# Patient Record
Sex: Male | Born: 1945 | Race: White | Hispanic: No | Marital: Married
Health system: Southern US, Community
[De-identification: ages and names within clinical notes are randomized; demographics above are authoritative.]

---

## 2020-09-16 ENCOUNTER — Other Ambulatory Visit: Payer: Self-pay | Admitting: Oncology

## 2020-09-16 ENCOUNTER — Other Ambulatory Visit (HOSPITAL_COMMUNITY): Payer: Self-pay | Admitting: Oncology

## 2020-09-16 DIAGNOSIS — J910 Malignant pleural effusion: Secondary | ICD-10-CM

## 2020-09-16 DIAGNOSIS — C7951 Secondary malignant neoplasm of bone: Secondary | ICD-10-CM

## 2020-10-03 ENCOUNTER — Ambulatory Visit (HOSPITAL_COMMUNITY): Payer: Medicare Other

## 2020-10-03 ENCOUNTER — Encounter (HOSPITAL_COMMUNITY): Payer: Self-pay

## 2020-10-05 ENCOUNTER — Other Ambulatory Visit: Payer: Self-pay

## 2020-10-05 ENCOUNTER — Encounter
Admission: RE | Admit: 2020-10-05 | Discharge: 2020-10-05 | Disposition: A | Discharge status: Home / Self Care | Payer: Medicare Other | Source: Ambulatory Visit | Attending: Oncology | Admitting: Oncology

## 2020-10-05 DIAGNOSIS — C61 Malignant neoplasm of prostate: Secondary | ICD-10-CM | POA: Insufficient documentation

## 2020-10-05 DIAGNOSIS — I700 Atherosclerosis of aorta: Secondary | ICD-10-CM | POA: Insufficient documentation

## 2020-10-05 DIAGNOSIS — R911 Solitary pulmonary nodule: Secondary | ICD-10-CM | POA: Diagnosis not present

## 2020-10-05 DIAGNOSIS — J9811 Atelectasis: Secondary | ICD-10-CM | POA: Insufficient documentation

## 2020-10-05 DIAGNOSIS — C7951 Secondary malignant neoplasm of bone: Secondary | ICD-10-CM | POA: Insufficient documentation

## 2020-10-05 DIAGNOSIS — J910 Malignant pleural effusion: Secondary | ICD-10-CM | POA: Insufficient documentation

## 2020-10-05 DIAGNOSIS — K8020 Calculus of gallbladder without cholecystitis without obstruction: Secondary | ICD-10-CM | POA: Diagnosis not present

## 2020-10-05 LAB — GLUCOSE, CAPILLARY: Glucose-Capillary: 111 mg/dL — ABNORMAL HIGH (ref 70–99)

## 2020-10-05 MED ORDER — FLUDEOXYGLUCOSE F - 18 (FDG) INJECTION
9.3000 | Freq: Once | INTRAVENOUS | Status: AC | PRN
  Administered 2020-10-05: 9.75 via INTRAVENOUS

## 2020-10-19 ENCOUNTER — Ambulatory Visit: Payer: Self-pay | Admitting: Urology

## 2020-11-04 NOTE — Addendum Note (Signed)
Encounter addended by: Annie Paras on: 11/04/2020 4:10 PM  Actions taken: Letter saved

## 2020-11-29 ENCOUNTER — Encounter: Payer: Self-pay | Admitting: Urology

## 2020-11-29 ENCOUNTER — Ambulatory Visit (INDEPENDENT_AMBULATORY_CARE_PROVIDER_SITE_OTHER): Payer: Medicare Other | Admitting: Urology

## 2020-11-29 ENCOUNTER — Other Ambulatory Visit: Payer: Self-pay

## 2020-11-29 VITALS — BP 127/70 | HR 76 | Temp 97.9°F | Ht 69.0 in | Wt 171.0 lb

## 2020-11-29 DIAGNOSIS — N3941 Urge incontinence: Secondary | ICD-10-CM | POA: Diagnosis not present

## 2020-11-29 DIAGNOSIS — C61 Malignant neoplasm of prostate: Secondary | ICD-10-CM | POA: Insufficient documentation

## 2020-11-29 MED ORDER — MIRABEGRON ER 25 MG PO TB24: 25.0000 mg | ORAL_TABLET | Freq: Every day | ORAL | 0 refills | Status: DC

## 2020-11-29 NOTE — Addendum Note (Signed)
Addended by: Pollyann Kennedy F on: 11/29/2020 03:09 PM   Modules accepted: Orders

## 2020-11-29 NOTE — Patient Instructions (Signed)
Prostate Cancer  The prostate is a male gland that helps make semen. It is located below a man's bladder, in front of the rectum. Prostate cancer is when abnormal cells grow in this gland. What are the causes? The cause of this condition is not known. What increases the risk? You are more likely to develop this condition if:  You are 73 years of age or older.  You are African American.  You have a family history of prostate cancer.  You have a family history of breast cancer. What are the signs or symptoms? Symptoms of this condition include:  A need to pee often.  Peeing that is weak, or pee that stops and starts.  Trouble starting or stopping your pee.  Inability to pee.  Blood in your pee or semen.  Pain in the lower back, lower belly (abdomen), hips, or upper thighs.  Trouble getting an erection.  Trouble emptying all of your pee. How is this treated? Treatment for this condition depends on your age, your health, the kind of treatment you like, and how far the cancer has spread. Treatments include:  Being watched. This is called observation. You will be tested from time to time, but you will not get treated. Tests are to make sure that the cancer is not growing.  Surgery. This may be done to remove the prostate, to remove the testicles, or to freeze or kill cancer cells.  Radiation. This uses a strong beam to kill cancer cells.  Ultrasound energy. This uses strong sound waves to kill cancer cells.  Chemotherapy. This uses medicines that stop cancer cells from increasing. This kills cancer cells and healthy cells.  Targeted therapy. This kills cancer cells only. Healthy cells are not affected.  Hormone treatment. This stops the body from making hormones that help the cancer cells to grow. Follow these instructions at home:  Take over-the-counter and prescription medicines only as told by your doctor.  Eat a healthy diet.  Get plenty of sleep.  Ask your  doctor for help to find a support group for men with prostate cancer.  If you have to go to the hospital, let your cancer doctor (oncologist) know.  Treatment may affect your ability to have sex. Touch, hold, hug, and caress your partner to have intimate moments.  Keep all follow-up visits as told by your doctor. This is important. Contact a doctor if:  You have new or more trouble peeing.  You have new or more blood in your pee.  You have new or more pain in your hips, back, or chest. Get help right away if:  You have weakness in your legs.  You lose feeling in your legs.  You cannot control your pee or your poop (stool).  You have chills or a fever. Summary  The prostate is a male gland that helps make semen.  Prostate cancer is when abnormal cells grow in this gland.  Treatment includes doing surgery, using medicines, using very strong beams, or watching without treatment.  Ask your doctor for help to find a support group for men with prostate cancer.  Contact a doctor if you have problems peeing or have any new pain that you did not have before. This information is not intended to replace advice given to you by your health care provider. Make sure you discuss any questions you have with your health care provider. Document Revised: 06/16/2019 Document Reviewed: 06/16/2019 Elsevier Patient Education  2021 Elsevier Inc.  

## 2020-11-29 NOTE — Progress Notes (Signed)
11/29/2020 2:54 PM   Brent Fitzgerald. 1945/10/19 [id number]  Referring provider: Barbee Cough, MD [place],  [place] [address]  Urinary incontinence  HPI: [name] is a 75yo here for evaluation of urinary incontinence. Starting 2-3 month ago he noted daytime urinary incontinence and nocturia 2x. He was hospitalized 2 months ago and had a foley at that time. He had a traumatic foley removal and since then the incontinence worsened. He has a weak urinary stream at night but a normal stream during the day. He uses 3 pads per day which are soaked.  He has a hx of prostatectomy in 2016. He was on ADT at the [place] but has not had any treatment in several months. He has a diagnosis of metastatic prostate cancer to the bone but no imaging/notes are available.  He has nocturia 2x.    PMH: No past medical history on file.  Surgical History:   Home Medications:  Allergies as of 11/29/2020      Reactions   Codeine Other (See Comments), Rash   nervous nervous      Medication List       Accurate as of Nov 29, 2020  2:54 PM. If you have any questions, ask your nurse or doctor.        budesonide-formoterol 160-4.5 MCG/ACT inhaler Commonly known as: SYMBICORT Inhale into the lungs.   Calcium Carb-Cholecalciferol 600-400 MG-UNIT Tabs Take 1 tablet by mouth daily.   cetirizine 10 MG tablet Commonly known as: ZYRTEC Take by mouth.   Cyanocobalamin 5000 MCG Subl Place under the tongue.   doxazosin 4 MG tablet Commonly known as: CARDURA Take by mouth.   flunisolide 25 MCG/ACT (0.025%) Soln Commonly known as: NASALIDE 2 sprays into each nostril daily.   lisinopril 5 MG tablet Commonly known as: ZESTRIL Take 5 mg by mouth daily.   metoprolol tartrate 50 MG tablet Commonly known as: LOPRESSOR Take 50 mg by mouth daily.   pravastatin 80 MG tablet Commonly known as: PRAVACHOL Take 80 mg by mouth daily.       Allergies:  Allergies  Allergen  Reactions  . Codeine Other (See Comments) and Rash    nervous nervous     Family History: No family history on file.  Social History:  has no history on file for tobacco use, alcohol use, and drug use.  ROS: All other review of systems were reviewed and are negative except what is noted above in HPI  Physical Exam: BP 127/70   Pulse 76   Temp 97.9 F (36.6 C)   Ht 5\' 9"  (1.753 m)   Wt 171 lb (77.6 kg)   BMI 25.25 kg/m   Constitutional:  Alert and oriented, No acute distress. HEENT: [place] AT, moist mucus membranes.  Trachea midline, no masses. Cardiovascular: No clubbing, cyanosis, or edema. Respiratory: Normal respiratory effort, no increased work of breathing. GI: Abdomen is soft, nontender, nondistended, no abdominal masses GU: No CVA tenderness.  Lymph: No cervical or inguinal lymphadenopathy. Skin: No rashes, bruises or suspicious lesions. Neurologic: Grossly intact, no focal deficits, moving all 4 extremities. Psychiatric: Normal mood and affect.  Laboratory Data: No results found for: WBC, HGB, HCT, MCV, PLT  No results found for: CREATININE  No results found for: PSA  No results found for: TESTOSTERONE  No results found for: HGBA1C  Urinalysis No results found for: COLORURINE, APPEARANCEUR, LABSPEC, PHURINE, GLUCOSEU, HGBUR, BILIRUBINUR, KETONESUR, PROTEINUR, UROBILINOGEN, NITRITE, LEUKOCYTESUR  No results found  for: LABMICR, WBCUA, RBCUA, LABEPIT, MUCUS, BACTERIA  Pertinent Imaging:  No results found for this or any previous visit.  No results found for this or any previous visit.  No results found for this or any previous visit.  No results found for this or any previous visit.  No results found for this or any previous visit.  No results found for this or any previous visit.  No results found for this or any previous visit.  No results found for this or any previous visit.   Assessment & Plan:    1. Urge incontinence of urine -We will  trial mirabegron 25mg  daily. If this fails to improve his incontinence we will proceed with cystoscopy - BLADDER SCAN AMB NON-IMAGING  2. Prostate Cancer Metastatic -PSA today, will call with results   No follow-ups on file.  Nicolette Bang, MD  [facility] [place]

## 2020-11-29 NOTE — Progress Notes (Signed)
post void residual=0  Urological Symptom Review  Patient is experiencing the following symptoms: Leakage of urine   Review of Systems  Gastrointestinal (upper)  : Negative for upper GI symptoms  Gastrointestinal (lower) : Negative for lower GI symptoms  Constitutional : Negative for symptoms  Skin: Negative for skin symptoms  Eyes: Negative for eye symptoms  Ear/Nose/Throat : Negative for Ear/Nose/Throat symptoms  Hematologic/Lymphatic: Negative for Hematologic/Lymphatic symptoms  Cardiovascular : Negative for cardiovascular symptoms  Respiratory : Negative for respiratory symptoms  Endocrine: Negative for endocrine symptoms  Musculoskeletal: Negative for musculoskeletal symptoms  Neurological: Negative for neurological symptoms  Psychologic: Negative for psychiatric symptoms

## 2020-11-30 LAB — PSA: Prostate Specific Ag, Serum: 1.6 ng/mL (ref 0.0–4.0)

## 2021-01-10 NOTE — Progress Notes (Signed)
Results mailed to patient;.

## 2021-01-13 ENCOUNTER — Encounter: Payer: Self-pay | Admitting: Urology

## 2021-01-13 ENCOUNTER — Other Ambulatory Visit: Payer: Self-pay

## 2021-01-13 ENCOUNTER — Ambulatory Visit (INDEPENDENT_AMBULATORY_CARE_PROVIDER_SITE_OTHER): Payer: Medicare Other | Admitting: Urology

## 2021-01-13 VITALS — BP 127/76 | HR 80

## 2021-01-13 DIAGNOSIS — C61 Malignant neoplasm of prostate: Secondary | ICD-10-CM

## 2021-01-13 DIAGNOSIS — N3941 Urge incontinence: Secondary | ICD-10-CM | POA: Diagnosis not present

## 2021-01-13 LAB — URINALYSIS, ROUTINE W REFLEX MICROSCOPIC
Bilirubin, UA: NEGATIVE
Glucose, UA: NEGATIVE
Ketones, UA: NEGATIVE
Leukocytes,UA: NEGATIVE
Nitrite, UA: NEGATIVE
Protein,UA: NEGATIVE
Specific Gravity, UA: 1.01 (ref 1.005–1.030)
Urobilinogen, Ur: 0.2 mg/dL (ref 0.2–1.0)
pH, UA: 5.5 (ref 5.0–7.5)

## 2021-01-13 LAB — BLADDER SCAN AMB NON-IMAGING: Scan Result: 34

## 2021-01-13 NOTE — Progress Notes (Signed)
post void residual=34  Urological Symptom Review  Patient is experiencing the following symptoms: Get up at night to urinate Stream starts and stops   Review of Systems  Gastrointestinal (upper)  : Negative for upper GI symptoms  Gastrointestinal (lower) : Negative for lower GI symptoms  Constitutional : Negative for symptoms  Skin: Negative for skin symptoms  Eyes: Negative for eye symptoms  Ear/Nose/Throat : Negative for Ear/Nose/Throat symptoms  Hematologic/Lymphatic: Easy bruising  Cardiovascular : Negative for cardiovascular symptoms  Respiratory : Negative for respiratory symptoms  Endocrine: Negative for endocrine symptoms  Musculoskeletal: Negative for musculoskeletal symptoms  Neurological: Negative for neurological symptoms  Psychologic: Negative for psychiatric symptoms

## 2021-01-13 NOTE — Progress Notes (Signed)
\    01/13/21  CC: urinary incontinence   HPI:  Blood pressure 127/76, pulse 80. NED. A&Ox3.   No respiratory distress   Abd soft, NT, ND Normal phallus with bilateral descended testicles  Cystoscopy Procedure Note  Patient identification was confirmed, informed consent was obtained, and patient was prepped using Betadine solution.  Lidocaine jelly was administered per urethral meatus.     Pre-Procedure: - Inspection reveals a normal caliber ureteral meatus.  Procedure: The flexible cystoscope was introduced without difficulty - No urethral strictures/lesions are present. -Open external sphincter with poor coaptstion - Normal bladder neck - Bilateral ureteral orifices identified - Bladder mucosa  reveals no ulcers, tumors, or lesions - No bladder stones - No trabeculation    Post-Procedure: - Patient tolerated the procedure well  Assessment/ Plan: Referral to Dr. Coral Ceo for consideration of AUS versus male sling    Nicolette Bang, MD

## 2021-01-13 NOTE — Patient Instructions (Signed)

## 2021-05-12 ENCOUNTER — Other Ambulatory Visit (HOSPITAL_COMMUNITY): Payer: Self-pay | Admitting: Oncology

## 2021-05-12 DIAGNOSIS — R911 Solitary pulmonary nodule: Secondary | ICD-10-CM

## 2021-05-18 ENCOUNTER — Encounter (HOSPITAL_COMMUNITY)
Admission: RE | Admit: 2021-05-18 | Discharge: 2021-05-18 | Disposition: A | Discharge status: Home / Self Care | Payer: Medicare Other | Source: Ambulatory Visit | Attending: Oncology | Admitting: Oncology

## 2021-05-18 ENCOUNTER — Other Ambulatory Visit: Payer: Self-pay

## 2021-05-18 DIAGNOSIS — R911 Solitary pulmonary nodule: Secondary | ICD-10-CM | POA: Insufficient documentation

## 2021-05-18 MED ORDER — FLUDEOXYGLUCOSE F - 18 (FDG) INJECTION
9.2650 | Freq: Once | INTRAVENOUS | Status: AC | PRN
  Administered 2021-05-18: 9.265 via INTRAVENOUS

## 2021-07-18 ENCOUNTER — Other Ambulatory Visit (HOSPITAL_COMMUNITY): Payer: Self-pay | Admitting: Hematology and Oncology

## 2021-07-18 DIAGNOSIS — C61 Malignant neoplasm of prostate: Secondary | ICD-10-CM

## 2021-07-19 ENCOUNTER — Ambulatory Visit: Payer: Medicare Other | Admitting: Urology

## 2021-07-28 ENCOUNTER — Other Ambulatory Visit (HOSPITAL_COMMUNITY): Payer: Medicare Other

## 2021-07-31 ENCOUNTER — Ambulatory Visit (HOSPITAL_COMMUNITY)
Admission: RE | Admit: 2021-07-31 | Discharge: 2021-07-31 | Disposition: A | Discharge status: Home / Self Care | Payer: Medicare Other | Source: Ambulatory Visit | Attending: Hematology and Oncology | Admitting: Hematology and Oncology

## 2021-07-31 ENCOUNTER — Other Ambulatory Visit: Payer: Self-pay

## 2021-07-31 DIAGNOSIS — C61 Malignant neoplasm of prostate: Secondary | ICD-10-CM | POA: Diagnosis not present

## 2021-07-31 MED ORDER — PIFLIFOLASTAT F 18 (PYLARIFY) INJECTION
9.0000 | Freq: Once | INTRAVENOUS | Status: AC
  Administered 2021-07-31: 8.92 via INTRAVENOUS

## 2021-08-04 ENCOUNTER — Other Ambulatory Visit (HOSPITAL_COMMUNITY): Payer: Self-pay | Admitting: Hematology and Oncology

## 2022-06-19 ENCOUNTER — Other Ambulatory Visit (HOSPITAL_COMMUNITY): Payer: Self-pay | Admitting: Hematology & Oncology

## 2022-06-19 DIAGNOSIS — C61 Malignant neoplasm of prostate: Secondary | ICD-10-CM

## 2022-07-04 ENCOUNTER — Ambulatory Visit (HOSPITAL_COMMUNITY)
Admission: RE | Admit: 2022-07-04 | Discharge: 2022-07-04 | Disposition: A | Discharge status: Home / Self Care | Payer: Medicare Other | Source: Ambulatory Visit | Attending: Hematology & Oncology | Admitting: Hematology & Oncology

## 2022-07-04 DIAGNOSIS — C7951 Secondary malignant neoplasm of bone: Secondary | ICD-10-CM | POA: Insufficient documentation

## 2022-07-04 DIAGNOSIS — C61 Malignant neoplasm of prostate: Secondary | ICD-10-CM | POA: Diagnosis present

## 2022-07-04 MED ORDER — PIFLIFOLASTAT F 18 (PYLARIFY) INJECTION
9.0000 | Freq: Once | INTRAVENOUS | Status: AC
  Administered 2022-07-04: 9.9 via INTRAVENOUS

## 2022-09-05 ENCOUNTER — Other Ambulatory Visit: Payer: Self-pay

## 2022-09-05 DIAGNOSIS — C61 Malignant neoplasm of prostate: Secondary | ICD-10-CM

## 2022-09-19 ENCOUNTER — Ambulatory Visit (HOSPITAL_COMMUNITY)
Admission: RE | Admit: 2022-09-19 | Discharge: 2022-09-19 | Disposition: A | Discharge status: Home / Self Care | Payer: Medicare Other | Source: Ambulatory Visit | Attending: Internal Medicine | Admitting: Internal Medicine

## 2022-09-19 DIAGNOSIS — C61 Malignant neoplasm of prostate: Secondary | ICD-10-CM | POA: Insufficient documentation

## 2022-09-19 MED ORDER — PIFLIFOLASTAT F 18 (PYLARIFY) INJECTION
9.0000 | Freq: Once | INTRAVENOUS | Status: AC
  Administered 2022-09-19: 9.8 via INTRAVENOUS

## 2022-10-09 ENCOUNTER — Ambulatory Visit: Payer: Medicare Other | Admitting: Infectious Disease

## 2022-10-09 ENCOUNTER — Encounter: Payer: Self-pay | Admitting: Infectious Disease

## 2022-10-09 ENCOUNTER — Inpatient Hospital Stay: Admission: RE | Admit: 2022-10-09 | Discharge: 2022-11-14 | Disposition: E | Payer: Medicare Other

## 2022-10-09 DIAGNOSIS — J189 Pneumonia, unspecified organism: Secondary | ICD-10-CM | POA: Insufficient documentation

## 2022-10-09 DIAGNOSIS — J910 Malignant pleural effusion: Secondary | ICD-10-CM | POA: Insufficient documentation

## 2022-10-09 DIAGNOSIS — R7881 Bacteremia: Secondary | ICD-10-CM | POA: Insufficient documentation

## 2022-10-09 DIAGNOSIS — T80219A Unspecified infection due to central venous catheter, initial encounter: Secondary | ICD-10-CM

## 2022-10-09 DIAGNOSIS — I482 Chronic atrial fibrillation, unspecified: Secondary | ICD-10-CM | POA: Insufficient documentation

## 2022-10-09 DIAGNOSIS — J9621 Acute and chronic respiratory failure with hypoxia: Secondary | ICD-10-CM | POA: Insufficient documentation

## 2022-10-09 DIAGNOSIS — A419 Sepsis, unspecified organism: Secondary | ICD-10-CM | POA: Insufficient documentation

## 2022-10-09 HISTORY — DX: Unspecified infection due to central venous catheter, initial encounter: T80.219A

## 2022-10-09 HISTORY — DX: Bacteremia: R78.81

## 2022-10-09 LAB — CBC
HCT: 22.1 % — ABNORMAL LOW (ref 39.0–52.0)
Hemoglobin: 7.4 g/dL — ABNORMAL LOW (ref 13.0–17.0)
MCH: 31.4 pg (ref 26.0–34.0)
MCHC: 33.5 g/dL (ref 30.0–36.0)
MCV: 93.6 fL (ref 80.0–100.0)
Platelets: 167 10*3/uL (ref 150–400)
RBC: 2.36 MIL/uL — ABNORMAL LOW (ref 4.22–5.81)
RDW: 19.2 % — ABNORMAL HIGH (ref 11.5–15.5)
WBC: 14.5 10*3/uL — ABNORMAL HIGH (ref 4.0–10.5)
nRBC: 0 % (ref 0.0–0.2)

## 2022-10-09 LAB — BASIC METABOLIC PANEL
Anion gap: 12 (ref 5–15)
BUN: 55 mg/dL — ABNORMAL HIGH (ref 8–23)
CO2: 22 mmol/L (ref 22–32)
Calcium: 8.1 mg/dL — ABNORMAL LOW (ref 8.9–10.3)
Chloride: 99 mmol/L (ref 98–111)
Creatinine, Ser: 3.67 mg/dL — ABNORMAL HIGH (ref 0.61–1.24)
GFR, Estimated: 17 mL/min — ABNORMAL LOW (ref >60)
Glucose, Bld: 121 mg/dL — ABNORMAL HIGH (ref 70–99)
Potassium: 4.7 mmol/L (ref 3.5–5.1)
Sodium: 133 mmol/L — ABNORMAL LOW (ref 135–145)

## 2022-10-09 NOTE — Progress Notes (Deleted)
Reason for infectious disease consult MRSA bacteremia in the context of a port  [place] physician: Legrand Rams, MD  Subjective:    Patient ID: Brent Feeler., male    DOB: Nov 18, 1945, 77 y.o.   MRN: [id number]  HPI  Brent Fitzgerald is a 77 year old man with past medical history significant for metastatic prostate cancer with port in place on chemotherapy, CAD, CHF who was admitted to [facility] with evidence of pneumonia.  His blood cultures were taken and grew MRSA in both sites.  He was given unnecessarily broad-spectrum antibiotics after the MRSA was discovered as vancomycin was continued alongside Zosyn and then meropenem.  He had a 2D echocardiogram that not have evidence of endocarditis.  I do not find record of a transesophageal echocardiogram to exclude endocarditis though.  His blood cultures cleared but his port was never removed despite the fact it was suspected of being a source of infection.  Regardless of whether it was a source of infection is undoubtably a site that was seeded in the context of his bacteremia and should have been removed.  He has been referred to [place] and infectious disease in [facility] for further management of his care after he was discharged on IV vancomycin.   Past Medical History:  Diagnosis Date  . Infected venous access port 10/09/2022  . MRSA bacteremia 10/09/2022    No past surgical history on file.  No family history on file.    Social History   Socioeconomic History  . Marital status: Married    Spouse name: Not on file  . Number of children: Not on file  . Years of education: Not on file  . Highest education level: Not on file  Occupational History  . Not on file  Tobacco Use  . Smoking status: Not on file  . Smokeless tobacco: Not on file  Substance and Sexual Activity  . Alcohol use: Not on file  . Drug use: Not on file  . Sexual activity: Not on file  Other Topics Concern  . Not on file  Social History  Narrative  . Not on file   Social Determinants of Health   Financial Resource Strain: Not on file  Food Insecurity: Not on file  Transportation Needs: Not on file  Physical Activity: Not on file  Stress: Not on file  Social Connections: Not on file    Allergies  Allergen Reactions  . Codeine Other (See Comments) and Rash    nervous nervous      Current Outpatient Medications:  .  budesonide-formoterol (SYMBICORT) 160-4.5 MCG/ACT inhaler, Inhale into the lungs., Disp: , Rfl:  .  Calcium Carb-Cholecalciferol 600-400 MG-UNIT TABS, Take 1 tablet by mouth daily., Disp: , Rfl:  .  cetirizine (ZYRTEC) 10 MG tablet, Take by mouth., Disp: , Rfl:  .  Cyanocobalamin 5000 MCG SUBL, Place under the tongue., Disp: , Rfl:  .  flunisolide (NASALIDE) 25 MCG/ACT (0.025%) SOLN, 2 sprays into each nostril daily., Disp: , Rfl:  .  lisinopril (ZESTRIL) 5 MG tablet, Take 5 mg by mouth daily., Disp: , Rfl:  .  metoprolol tartrate (LOPRESSOR) 50 MG tablet, Take 50 mg by mouth daily., Disp: , Rfl:  .  mirabegron ER (MYRBETRIQ) 25 MG TB24 tablet, Take 1 tablet (25 mg total) by mouth daily., Disp: 30 tablet, Rfl: 0 .  pravastatin (PRAVACHOL) 80 MG tablet, Take 80 mg by mouth daily., Disp: , Rfl:   Review of Systems  Constitutional:  Negative for activity change,  appetite change, chills, diaphoresis, fatigue, fever and unexpected weight change.  HENT:  Negative for congestion, rhinorrhea, sinus pressure, sneezing, sore throat and trouble swallowing.   Eyes:  Negative for photophobia and visual disturbance.  Respiratory:  Negative for cough, chest tightness, shortness of breath, wheezing and stridor.   Cardiovascular:  Negative for chest pain, palpitations and leg swelling.  Gastrointestinal:  Negative for abdominal distention, abdominal pain, anal bleeding, blood in stool, constipation, diarrhea, nausea and vomiting.  Genitourinary:  Negative for difficulty urinating, dysuria, flank pain and hematuria.   Musculoskeletal:  Negative for arthralgias, back pain, gait problem, joint swelling and myalgias.  Skin:  Negative for color change, pallor, rash and wound.  Neurological:  Negative for dizziness, tremors, weakness and light-headedness.  Hematological:  Negative for adenopathy. Does not bruise/bleed easily.  Psychiatric/Behavioral:  Negative for agitation, behavioral problems, confusion, decreased concentration, dysphoric mood and sleep disturbance.       Objective:   Physical Exam Constitutional:      Appearance: He is well-developed.  HENT:     Head: Normocephalic and atraumatic.  Eyes:     Conjunctiva/sclera: Conjunctivae normal.  Cardiovascular:     Rate and Rhythm: Normal rate and regular rhythm.  Pulmonary:     Effort: Pulmonary effort is normal. No respiratory distress.     Breath sounds: No wheezing.  Abdominal:     General: There is no distension.     Palpations: Abdomen is soft.  Musculoskeletal:        General: No tenderness. Normal range of motion.     Cervical back: Normal range of motion and neck supple.  Skin:    General: Skin is warm and dry.     Coloration: Skin is not pale.     Findings: No erythema or rash.  Neurological:     General: No focal deficit present.     Mental Status: He is alert and oriented to person, place, and time.  Psychiatric:        Mood and Affect: Mood normal.        Behavior: Behavior normal.        Thought Content: Thought content normal.        Judgment: Judgment normal.         Assessment & Plan:

## 2022-10-10 ENCOUNTER — Other Ambulatory Visit (HOSPITAL_COMMUNITY): Payer: Medicare Other

## 2022-10-10 LAB — BASIC METABOLIC PANEL
Anion gap: 14 (ref 5–15)
BUN: 63 mg/dL — ABNORMAL HIGH (ref 8–23)
CO2: 19 mmol/L — ABNORMAL LOW (ref 22–32)
Calcium: 8 mg/dL — ABNORMAL LOW (ref 8.9–10.3)
Chloride: 101 mmol/L (ref 98–111)
Creatinine, Ser: 3.71 mg/dL — ABNORMAL HIGH (ref 0.61–1.24)
GFR, Estimated: 16 mL/min — ABNORMAL LOW (ref >60)
Glucose, Bld: 92 mg/dL (ref 70–99)
Potassium: 5.4 mmol/L — ABNORMAL HIGH (ref 3.5–5.1)
Sodium: 134 mmol/L — ABNORMAL LOW (ref 135–145)

## 2022-10-10 LAB — CBC
HCT: 23.3 % — ABNORMAL LOW (ref 39.0–52.0)
Hemoglobin: 7.2 g/dL — ABNORMAL LOW (ref 13.0–17.0)
MCH: 30.1 pg (ref 26.0–34.0)
MCHC: 30.9 g/dL (ref 30.0–36.0)
MCV: 97.5 fL (ref 80.0–100.0)
Platelets: 171 10*3/uL (ref 150–400)
RBC: 2.39 MIL/uL — ABNORMAL LOW (ref 4.22–5.81)
RDW: 18.9 % — ABNORMAL HIGH (ref 11.5–15.5)
WBC: 14.8 10*3/uL — ABNORMAL HIGH (ref 4.0–10.5)
nRBC: 0 % (ref 0.0–0.2)

## 2022-10-10 LAB — URINALYSIS, ROUTINE W REFLEX MICROSCOPIC
Bilirubin Urine: NEGATIVE
Glucose, UA: NEGATIVE mg/dL
Ketones, ur: 5 mg/dL — AB
Leukocytes,Ua: NEGATIVE
Nitrite: NEGATIVE
Protein, ur: 30 mg/dL — AB
Specific Gravity, Urine: 1.011 (ref 1.005–1.030)
pH: 5 (ref 5.0–8.0)

## 2022-10-10 LAB — BLOOD GAS, ARTERIAL
Acid-base deficit: 4.8 mmol/L — ABNORMAL HIGH (ref 0.0–2.0)
Bicarbonate: 21.6 mmol/L (ref 20.0–28.0)
O2 Saturation: 97.9 %
Patient temperature: 36.9
pCO2 arterial: 44 mmHg (ref 32–48)
pH, Arterial: 7.3 — ABNORMAL LOW (ref 7.35–7.45)
pO2, Arterial: 79 mmHg — ABNORMAL LOW (ref 83–108)

## 2022-10-10 LAB — C DIFFICILE QUICK SCREEN W PCR REFLEX
C Diff antigen: NEGATIVE
C Diff interpretation: NOT DETECTED
C Diff toxin: NEGATIVE

## 2022-10-10 LAB — CK: Total CK: 41 U/L — ABNORMAL LOW (ref 49–397)

## 2022-10-11 ENCOUNTER — Other Ambulatory Visit (HOSPITAL_COMMUNITY): Payer: Medicare Other

## 2022-10-11 DIAGNOSIS — R6520 Severe sepsis without septic shock: Secondary | ICD-10-CM

## 2022-10-11 DIAGNOSIS — I482 Chronic atrial fibrillation, unspecified: Secondary | ICD-10-CM | POA: Diagnosis not present

## 2022-10-11 DIAGNOSIS — J189 Pneumonia, unspecified organism: Secondary | ICD-10-CM

## 2022-10-11 DIAGNOSIS — J9621 Acute and chronic respiratory failure with hypoxia: Secondary | ICD-10-CM | POA: Diagnosis not present

## 2022-10-11 DIAGNOSIS — A419 Sepsis, unspecified organism: Secondary | ICD-10-CM

## 2022-10-11 DIAGNOSIS — J910 Malignant pleural effusion: Secondary | ICD-10-CM

## 2022-10-11 LAB — CK: Total CK: 23 U/L — ABNORMAL LOW (ref 49–397)

## 2022-10-11 LAB — CBC
HCT: 25.6 % — ABNORMAL LOW (ref 39.0–52.0)
Hemoglobin: 7.9 g/dL — ABNORMAL LOW (ref 13.0–17.0)
MCH: 29.9 pg (ref 26.0–34.0)
MCHC: 30.9 g/dL (ref 30.0–36.0)
MCV: 97 fL (ref 80.0–100.0)
Platelets: 165 10*3/uL (ref 150–400)
RBC: 2.64 MIL/uL — ABNORMAL LOW (ref 4.22–5.81)
RDW: 19 % — ABNORMAL HIGH (ref 11.5–15.5)
WBC: 18.8 10*3/uL — ABNORMAL HIGH (ref 4.0–10.5)
nRBC: 0 % (ref 0.0–0.2)

## 2022-10-11 LAB — BASIC METABOLIC PANEL
Anion gap: 13 (ref 5–15)
BUN: 68 mg/dL — ABNORMAL HIGH (ref 8–23)
CO2: 21 mmol/L — ABNORMAL LOW (ref 22–32)
Calcium: 8.2 mg/dL — ABNORMAL LOW (ref 8.9–10.3)
Chloride: 104 mmol/L (ref 98–111)
Creatinine, Ser: 3.82 mg/dL — ABNORMAL HIGH (ref 0.61–1.24)
GFR, Estimated: 16 mL/min — ABNORMAL LOW (ref >60)
Glucose, Bld: 116 mg/dL — ABNORMAL HIGH (ref 70–99)
Potassium: 4.8 mmol/L (ref 3.5–5.1)
Sodium: 138 mmol/L (ref 135–145)

## 2022-10-11 NOTE — Consult Note (Signed)
[facility] [place]  PULMONARY SERVICE  Date of Service: 10/11/2022  PULMONARY CRITICAL CARE CONSULT   Brent Fitzgerald.  [id number]  DOB: [birth year]   DOA: 10/09/2022  Referring Physician: Satira Sark, MD  HPI: Brent Fitzgerald. is a 77 y.o. male seen for follow up of Acute on Chronic Respiratory Failure.  Patient has multiple medical problems including chronic obstructive pulmonary disease prostate cancer hyperlipidemia malignant pleural effusion pneumonia who presents to the hospital because of increasing shortness of breath productive cough and weakness secondary to a left upper lobe pneumonia.  Patient had been initially treated as an outpatient but failed therapy.  Patient subsequently was admitted for IV antibiotics.  He continues to be on oxygen therapy.  Patient has had some issues with desaturations and has required going on BiPAP at nighttime.  As far as the pneumonia is concerned patient received vancomycin and Zosyn and currently is now on meropenem.  He continues to have issues with hypoxia  Review of Systems:  ROS performed and is unremarkable other than noted above.  Past Medical History:  Diagnosis Date   Infected venous access port 10/09/2022   MRSA bacteremia 10/09/2022    No past surgical history on file.  Social History:    has no history on file for tobacco use, alcohol use, and drug use.  Family History: Non-Contributory to the present illness  Allergies  Allergen Reactions   Codeine Other (See Comments) and Rash    nervous nervous     Medications: Reviewed on Rounds  Physical Exam:  Vitals: Temperature 97.2 pulse 110 respiratory rate is 27 blood pressure 125/94 saturations 100%  Ventilator Settings patient currently is on 2 L nasal cannula  General: Comfortable at this time Eyes: Grossly normal lids, irises & conjunctiva ENT: grossly tongue is normal Neck: no obvious  mass Cardiovascular: S1-S2 normal no gallop or rub Respiratory: Coarse breath sounds with some rhonchi Abdomen: Soft and nontender Skin: no rash seen on limited exam Musculoskeletal: not rigid Psychiatric:unable to assess Neurologic: no seizure no involuntary movements         Labs on Admission:  Basic Metabolic Panel: Recent Labs  Lab 10/09/22 2153 10/10/22 1028 10/11/22 0121  NA 133* 134* 138  K 4.7 5.4* 4.8  CL 99 101 104  CO2 22 19* 21*  GLUCOSE 121* 92 116*  BUN 55* 63* 68*  CREATININE 3.67* 3.71* 3.82*  CALCIUM 8.1* 8.0* 8.2*    Recent Labs  Lab 10/10/22 0628  PHART 7.3*  PCO2ART 44  PO2ART 79*  HCO3 21.6  O2SAT 97.9    Liver Function Tests: No results for input(s): "AST", "ALT", "ALKPHOS", "BILITOT", "PROT", "ALBUMIN" in the last 168 hours. No results for input(s): "LIPASE", "AMYLASE" in the last 168 hours. No results for input(s): "AMMONIA" in the last 168 hours.  CBC: Recent Labs  Lab 10/09/22 2153 10/10/22 1028 10/11/22 0121  WBC 14.5* 14.8* 18.8*  HGB 7.4* 7.2* 7.9*  HCT 22.1* 23.3* 25.6*  MCV 93.6 97.5 97.0  PLT 167 171 165    Cardiac Enzymes: Recent Labs  Lab 10/10/22 2219  CKTOTAL 41*    BNP (last 3 results) No results for input(s): "BNP" in the last 8760 hours.  ProBNP (last 3 results) No results for input(s): "PROBNP" in the last 8760 hours.   Radiological Exams on Admission: DG CHEST PORT 1 VIEW  Result Date: 10/10/2022 CLINICAL DATA:  Pneumonia. EXAM: PORTABLE CHEST 1 VIEW COMPARISON:  Chest radiograph  10/05/2022 and chest CT 10/08/2022 FINDINGS: A right jugular Port-A-Cath remains in place, terminating over the lower SVC. 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 The cardiomediastinal silhouette is unchanged with normal heart size. Dense consolidation in the left upper lobe is stable to minimally improved compared to the prior radiograph. The right lung remains clear. No sizable pleural effusion or pneumothorax is identified.  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 IMPRESSION: Unchanged to minimally improved left upper lobe pneumonia. Electronically Signed   By: Logan Bores M.D.   On: 10/10/2022 13:22    Assessment/Plan Active Problems:   Acute on chronic respiratory failure with hypoxia (HCC)   Left upper lobe pneumonia   Severe sepsis (HCC)   Chronic atrial fibrillation with RVR (HCC)   Malignant pleural effusion   Acute on chronic respiratory failure hypoxia patient currently is on oxygen therapy 2 L he was on oxygen at home however he has a significant upper lobe infiltrate still present on the last chest x-ray.  Patient currently is on meropenem but has been treated with vancomycin and Zosyn.  Suggested getting a follow-up sputum culture.  Patient is a DNR  Left upper lobe pneumonia patient still has significant infiltrates suggested getting a culture on him to reassess Sepsis patient has been on pressors currently is on Levophed low-dose Atrial fibrillation RVR rate controlled we will continue with medical management. Malignant pleural effusion at this time chest x-ray does not show any effusions  I have personally seen and evaluated the patient, evaluated laboratory and imaging results, formulated the assessment and plan and placed orders. The Patient requires high complexity decision making with multiple systems involvement.  Case was discussed on Rounds with the Respiratory Therapy Director and the Respiratory staff Time Spent 69minutes  Shaniqwa Horsman A Bettye Sitton, MD [facility] Pulmonary Critical Care Medicine Sleep Medicine

## 2022-10-12 ENCOUNTER — Other Ambulatory Visit (HOSPITAL_COMMUNITY): Payer: Medicare Other

## 2022-10-12 DIAGNOSIS — J910 Malignant pleural effusion: Secondary | ICD-10-CM | POA: Diagnosis not present

## 2022-10-12 DIAGNOSIS — I482 Chronic atrial fibrillation, unspecified: Secondary | ICD-10-CM | POA: Diagnosis not present

## 2022-10-12 DIAGNOSIS — J189 Pneumonia, unspecified organism: Secondary | ICD-10-CM | POA: Diagnosis not present

## 2022-10-12 DIAGNOSIS — J9621 Acute and chronic respiratory failure with hypoxia: Secondary | ICD-10-CM | POA: Diagnosis not present

## 2022-10-12 LAB — VANCOMYCIN, TROUGH: Vancomycin Tr: 20 ug/mL (ref 15–20)

## 2022-10-12 LAB — CBC
HCT: 23 % — ABNORMAL LOW (ref 39.0–52.0)
Hemoglobin: 7.5 g/dL — ABNORMAL LOW (ref 13.0–17.0)
MCH: 30.7 pg (ref 26.0–34.0)
MCHC: 32.6 g/dL (ref 30.0–36.0)
MCV: 94.3 fL (ref 80.0–100.0)
Platelets: 132 10*3/uL — ABNORMAL LOW (ref 150–400)
RBC: 2.44 MIL/uL — ABNORMAL LOW (ref 4.22–5.81)
RDW: 18.9 % — ABNORMAL HIGH (ref 11.5–15.5)
WBC: 14.3 10*3/uL — ABNORMAL HIGH (ref 4.0–10.5)
nRBC: 0 % (ref 0.0–0.2)

## 2022-10-12 LAB — BASIC METABOLIC PANEL
Anion gap: 12 (ref 5–15)
BUN: 71 mg/dL — ABNORMAL HIGH (ref 8–23)
CO2: 21 mmol/L — ABNORMAL LOW (ref 22–32)
Calcium: 8 mg/dL — ABNORMAL LOW (ref 8.9–10.3)
Chloride: 101 mmol/L (ref 98–111)
Creatinine, Ser: 3.61 mg/dL — ABNORMAL HIGH (ref 0.61–1.24)
GFR, Estimated: 17 mL/min — ABNORMAL LOW (ref >60)
Glucose, Bld: 123 mg/dL — ABNORMAL HIGH (ref 70–99)
Potassium: 4.1 mmol/L (ref 3.5–5.1)
Sodium: 134 mmol/L — ABNORMAL LOW (ref 135–145)

## 2022-10-12 NOTE — Progress Notes (Signed)
Pulmonary [place]   PULMONARY CRITICAL CARE SERVICE  PROGRESS NOTE     Brent View.  [id number]  DOB: 29-Dec-1945   DOA: 10/03/2022  Referring Physician: Satira Sark, MD  HPI: Brent Krus. is a 77 y.o. male being followed for ventilator/airway/oxygen weaning Acute on Chronic Respiratory Failure. ***  Medications: Reviewed on Rounds  Physical Exam:  Vitals: ***  Ventilator Settings ***  General: Comfortable at this time Neck: supple Cardiovascular: no malignant arrhythmias Respiratory: *** Skin: no rash seen on limited exam Musculoskeletal: No gross abnormality Psychiatric:unable to assess Neurologic:no involuntary movements         Lab Data:   Basic Metabolic Panel: Recent Labs  Lab 10/13/2022 2153 10/10/22 1028 10/11/22 0121 10/12/22 0204  NA 133* 134* 138 134*  K 4.7 5.4* 4.8 4.1  CL 99 101 104 101  CO2 22 19* 21* 21*  GLUCOSE 121* 92 116* 123*  BUN 55* 63* 68* 71*  CREATININE 3.67* 3.71* 3.82* 3.61*  CALCIUM 8.1* 8.0* 8.2* 8.0*    ABG: Recent Labs  Lab 10/10/22 0628  PHART 7.3*  PCO2ART 44  PO2ART 79*  HCO3 21.6  O2SAT 97.9    Liver Function Tests: No results for input(s): "AST", "ALT", "ALKPHOS", "BILITOT", "PROT", "ALBUMIN" in the last 168 hours. No results for input(s): "LIPASE", "AMYLASE" in the last 168 hours. No results for input(s): "AMMONIA" in the last 168 hours.  CBC: Recent Labs  Lab 10/07/2022 2153 10/10/22 1028 10/11/22 0121 10/12/22 0204  WBC 14.5* 14.8* 18.8* 14.3*  HGB 7.4* 7.2* 7.9* 7.5*  HCT 22.1* 23.3* 25.6* 23.0*  MCV 93.6 97.5 97.0 94.3  PLT 167 171 165 132*    Cardiac Enzymes: Recent Labs  Lab 10/10/22 2219 10/11/22 1656  CKTOTAL 41* 23*    BNP (last 3 results) No results for input(s): "BNP" in the last 8760 hours.  ProBNP (last 3 results) No results for input(s): "PROBNP" in the last 8760 hours.  Radiological Exams: DG  CHEST PORT 1 VIEW  Result Date: 10/12/2022 CLINICAL DATA:  Dyspnea EXAM: PORTABLE CHEST 1 VIEW COMPARISON:  10/11/2022 FINDINGS: Status post median sternotomy and CABG. Right chest port with tip approximating the superior cavoatrial junction. Unchanged cardiac and mediastinal contours. Redemonstrated bilateral airspace disease, most prominent in the left greater than right upper lungs, not significantly changed from the prior exam. Likely trace left pleural effusion. No pneumothorax. No acute osseous abnormality. IMPRESSION: Redemonstrated bilateral airspace disease, most likely multifocal pneumonia, not significantly changed from the prior exam. Electronically Signed   By: Merilyn Baba M.D.   On: 10/12/2022 11:05   US RENAL  Result Date: 10/12/2022 CLINICAL DATA:  Kidney failure EXAM: RENAL / URINARY TRACT ULTRASOUND COMPLETE COMPARISON:  09/15/2022 FINDINGS: Right Kidney: Renal measurements: 14 x 6 x 4 cm = volume: 250 mL. Simple cyst at the lower pole measuring 19 mm. No follow-up imaging is recommended. No hydronephrosis. Left Kidney: Renal measurements: 13 x 6 x 5 cm = volume: 210 mL. Echogenicity within normal limits. No mass or hydronephrosis visualized. Bladder: Nonvisualized and presumably collapsed behind the shadowing symphysis pubis. IMPRESSION: No explanation for renal failure.  No hydronephrosis. Electronically Signed   By: Jorje Guild M.D.   On: 10/12/2022 03:58   DG Chest 1 View  Result Date: 10/11/2022 CLINICAL DATA:  Provided history: Pneumonia. EXAM: CHEST  1 VIEW COMPARISON:  Prior chest radiographs 10/10/2022 and earlier. Chest CT 10/08/2022. FINDINGS: Right chest infusion port catheter  with tip at the level of the superior cavoatrial junction. Prior median sternotomy/CABG. The cardiomediastinal silhouette is unchanged. Persistent extensive left upper lobe airspace disease. New from the prior chest radiograph of 10/10/2022, there is patchy airspace disease elsewhere within the  bilateral lungs, most notably within the right upper lobe. Background prominence of the interstitial lung markings. No evidence of pleural effusion or pneumothorax. Known diffuse sclerotic osseous metastases. IMPRESSION: Persistent extensive left upper lobe airspace disease. New from the prior chest radiograph of 10/10/2022, there is patchy airspace disease elsewhere within the bilateral lungs, most notably within the right upper lobe. Findings are compatible with multifocal pneumonia. Electronically Signed   By: Kellie Simmering D.O.   On: 10/11/2022 10:38   DG CHEST PORT 1 VIEW  Result Date: 10/10/2022 CLINICAL DATA:  Pneumonia. EXAM: PORTABLE CHEST 1 VIEW COMPARISON:  Chest radiograph 10/05/2022 and chest CT 10/08/2022 FINDINGS: A right jugular Port-A-Cath remains in place, terminating over the lower SVC. 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 The cardiomediastinal silhouette is unchanged with normal heart size. Dense consolidation in the left upper lobe is stable to minimally improved compared to the prior radiograph. The right lung remains clear. No sizable pleural effusion or pneumothorax is identified. 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 IMPRESSION: Unchanged to minimally improved left upper lobe pneumonia. Electronically Signed   By: Logan Bores M.D.   On: 10/10/2022 13:22    Assessment/Plan Active Problems:   Acute on chronic respiratory failure with hypoxia (HCC)   Left upper lobe pneumonia   Severe sepsis (HCC)   Chronic atrial fibrillation with RVR (HCC)   Malignant pleural effusion   *** *** *** *** ***   I have personally seen and evaluated the patient, evaluated laboratory and imaging results, formulated the assessment and plan and placed orders. The Patient requires high complexity decision making with multiple systems involvement.  Rounds were done with the Respiratory Therapy Director and Staff therapists and discussed with nursing staff also.  Allyne Gee, MD [facility] Pulmonary Critical Care Medicine Sleep Medicine

## 2022-10-13 DIAGNOSIS — I482 Chronic atrial fibrillation, unspecified: Secondary | ICD-10-CM | POA: Diagnosis not present

## 2022-10-13 DIAGNOSIS — J189 Pneumonia, unspecified organism: Secondary | ICD-10-CM | POA: Diagnosis not present

## 2022-10-13 DIAGNOSIS — J910 Malignant pleural effusion: Secondary | ICD-10-CM | POA: Diagnosis not present

## 2022-10-13 DIAGNOSIS — J9621 Acute and chronic respiratory failure with hypoxia: Secondary | ICD-10-CM | POA: Diagnosis not present

## 2022-10-13 LAB — CBC
HCT: 21.9 % — ABNORMAL LOW (ref 39.0–52.0)
Hemoglobin: 6.8 g/dL — CL (ref 13.0–17.0)
MCH: 30.6 pg (ref 26.0–34.0)
MCHC: 31.1 g/dL (ref 30.0–36.0)
MCV: 98.6 fL (ref 80.0–100.0)
Platelets: 137 10*3/uL — ABNORMAL LOW (ref 150–400)
RBC: 2.22 MIL/uL — ABNORMAL LOW (ref 4.22–5.81)
RDW: 18.8 % — ABNORMAL HIGH (ref 11.5–15.5)
WBC: 12.6 10*3/uL — ABNORMAL HIGH (ref 4.0–10.5)
nRBC: 0 % (ref 0.0–0.2)

## 2022-10-13 LAB — HEMOGLOBIN AND HEMATOCRIT, BLOOD
HCT: 25.3 % — ABNORMAL LOW (ref 39.0–52.0)
Hemoglobin: 7.8 g/dL — ABNORMAL LOW (ref 13.0–17.0)

## 2022-10-13 LAB — CULTURE, RESPIRATORY W GRAM STAIN
Culture: NORMAL
Gram Stain: NONE SEEN

## 2022-10-13 LAB — BASIC METABOLIC PANEL
Anion gap: 16 — ABNORMAL HIGH (ref 5–15)
BUN: 83 mg/dL — ABNORMAL HIGH (ref 8–23)
CO2: 18 mmol/L — ABNORMAL LOW (ref 22–32)
Calcium: 7.9 mg/dL — ABNORMAL LOW (ref 8.9–10.3)
Chloride: 104 mmol/L (ref 98–111)
Creatinine, Ser: 3.62 mg/dL — ABNORMAL HIGH (ref 0.61–1.24)
GFR, Estimated: 17 mL/min — ABNORMAL LOW (ref >60)
Glucose, Bld: 100 mg/dL — ABNORMAL HIGH (ref 70–99)
Potassium: 4.7 mmol/L (ref 3.5–5.1)
Sodium: 138 mmol/L (ref 135–145)

## 2022-10-13 LAB — ABO/RH: ABO/RH(D): A POS

## 2022-10-13 LAB — PREPARE RBC (CROSSMATCH)

## 2022-10-13 NOTE — Progress Notes (Signed)
Pulmonary [place]   PULMONARY CRITICAL CARE SERVICE  PROGRESS NOTE     Brent Fitzgerald.  [id number]  DOB: 28-Mar-1946   DOA: 09/15/2022  Referring Physician: Satira Sark, MD  HPI: Brent Koda. is a 77 y.o. male being followed for ventilator/airway/oxygen weaning Acute on Chronic Respiratory Failure.  Patient currently is on BiPAP on 14/5 patient has been requiring 40% FiO2  Medications: Reviewed on Rounds  Physical Exam:  Vitals: Temperature is 97.9 pulse of 79 respiratory is 24 blood pressure 136/79 saturation is 98%  Ventilator Settings BiPAP FiO2 40% pressure 14/5  General: Comfortable at this time Neck: supple Cardiovascular: no malignant arrhythmias Respiratory: Scattered rhonchi expansion is equal Skin: no rash seen on limited exam Musculoskeletal: No gross abnormality Psychiatric:unable to assess Neurologic:no involuntary movements         Lab Data:   Basic Metabolic Panel: Recent Labs  Lab 09/25/2022 2153 10/10/22 1028 10/11/22 0121 10/12/22 0204  NA 133* 134* 138 134*  K 4.7 5.4* 4.8 4.1  CL 99 101 104 101  CO2 22 19* 21* 21*  GLUCOSE 121* 92 116* 123*  BUN 55* 63* 68* 71*  CREATININE 3.67* 3.71* 3.82* 3.61*  CALCIUM 8.1* 8.0* 8.2* 8.0*    ABG: Recent Labs  Lab 10/10/22 0628  PHART 7.3*  PCO2ART 44  PO2ART 79*  HCO3 21.6  O2SAT 97.9    Liver Function Tests: No results for input(s): "AST", "ALT", "ALKPHOS", "BILITOT", "PROT", "ALBUMIN" in the last 168 hours. No results for input(s): "LIPASE", "AMYLASE" in the last 168 hours. No results for input(s): "AMMONIA" in the last 168 hours.  CBC: Recent Labs  Lab 09/26/2022 2153 10/10/22 1028 10/11/22 0121 10/12/22 0204  WBC 14.5* 14.8* 18.8* 14.3*  HGB 7.4* 7.2* 7.9* 7.5*  HCT 22.1* 23.3* 25.6* 23.0*  MCV 93.6 97.5 97.0 94.3  PLT 167 171 165 132*    Cardiac Enzymes: Recent Labs  Lab 10/10/22 2219 10/11/22 1656   CKTOTAL 41* 23*    BNP (last 3 results) No results for input(s): "BNP" in the last 8760 hours.  ProBNP (last 3 results) No results for input(s): "PROBNP" in the last 8760 hours.  Radiological Exams: DG CHEST PORT 1 VIEW  Result Date: 10/12/2022 CLINICAL DATA:  Dyspnea EXAM: PORTABLE CHEST 1 VIEW COMPARISON:  10/11/2022 FINDINGS: Status post median sternotomy and CABG. Right chest port with tip approximating the superior cavoatrial junction. Unchanged cardiac and mediastinal contours. Redemonstrated bilateral airspace disease, most prominent in the left greater than right upper lungs, not significantly changed from the prior exam. Likely trace left pleural effusion. No pneumothorax. No acute osseous abnormality. IMPRESSION: Redemonstrated bilateral airspace disease, most likely multifocal pneumonia, not significantly changed from the prior exam. Electronically Signed   By: Merilyn Baba M.D.   On: 10/12/2022 11:05   US RENAL  Result Date: 10/12/2022 CLINICAL DATA:  Kidney failure EXAM: RENAL / URINARY TRACT ULTRASOUND COMPLETE COMPARISON:  10/12/2022 FINDINGS: Right Kidney: Renal measurements: 14 x 6 x 4 cm = volume: 250 mL. Simple cyst at the lower pole measuring 19 mm. No follow-up imaging is recommended. No hydronephrosis. Left Kidney: Renal measurements: 13 x 6 x 5 cm = volume: 210 mL. Echogenicity within normal limits. No mass or hydronephrosis visualized. Bladder: Nonvisualized and presumably collapsed behind the shadowing symphysis pubis. IMPRESSION: No explanation for renal failure.  No hydronephrosis. Electronically Signed   By: Jorje Guild M.D.   On: 10/12/2022 03:58  Assessment/Plan Active Problems:   Acute on chronic respiratory failure with hypoxia (HCC)   Left upper lobe pneumonia   Severe sepsis (HCC)   Chronic atrial fibrillation with RVR (HCC)   Malignant pleural effusion   Acute on chronic respiratory failure hypoxia plan is going to continue with trying to wean  the BiPAP we will try him on high flow oxygen if patient is able to tolerate. Left upper lobe pneumonia has been treated we will continue with supportive care Severe sepsis treated and resolution continue to monitor Chronic atrial fibrillation rate is controlled Malignant pleural effusion no change will continue to follow along closely   I have personally seen and evaluated the patient, evaluated laboratory and imaging results, formulated the assessment and plan and placed orders. The Patient requires high complexity decision making with multiple systems involvement.  Rounds were done with the Respiratory Therapy Director and Staff therapists and discussed with nursing staff also.  Allyne Gee, MD [facility] Pulmonary Critical Care Medicine Sleep Medicine

## 2022-10-14 ENCOUNTER — Other Ambulatory Visit (HOSPITAL_COMMUNITY): Payer: Medicare Other

## 2022-10-14 DIAGNOSIS — J189 Pneumonia, unspecified organism: Secondary | ICD-10-CM | POA: Diagnosis not present

## 2022-10-14 DIAGNOSIS — J910 Malignant pleural effusion: Secondary | ICD-10-CM | POA: Diagnosis not present

## 2022-10-14 DIAGNOSIS — I482 Chronic atrial fibrillation, unspecified: Secondary | ICD-10-CM | POA: Diagnosis not present

## 2022-10-14 DIAGNOSIS — J9621 Acute and chronic respiratory failure with hypoxia: Secondary | ICD-10-CM | POA: Diagnosis not present

## 2022-10-14 LAB — PREPARE RBC (CROSSMATCH)

## 2022-10-14 LAB — CBC
HCT: 24.5 % — ABNORMAL LOW (ref 39.0–52.0)
HCT: 21.4 % — ABNORMAL LOW (ref 39.0–52.0)
Hemoglobin: 7.7 g/dL — ABNORMAL LOW (ref 13.0–17.0)
Hemoglobin: 6.8 g/dL — CL (ref 13.0–17.0)
MCH: 29.7 pg (ref 26.0–34.0)
MCH: 29.6 pg (ref 26.0–34.0)
MCHC: 31.4 g/dL (ref 30.0–36.0)
MCHC: 31.8 g/dL (ref 30.0–36.0)
MCV: 94.6 fL (ref 80.0–100.0)
MCV: 93 fL (ref 80.0–100.0)
Platelets: 144 10*3/uL — ABNORMAL LOW (ref 150–400)
Platelets: 109 10*3/uL — ABNORMAL LOW (ref 150–400)
RBC: 2.59 MIL/uL — ABNORMAL LOW (ref 4.22–5.81)
RBC: 2.3 MIL/uL — ABNORMAL LOW (ref 4.22–5.81)
RDW: 20.6 % — ABNORMAL HIGH (ref 11.5–15.5)
RDW: 20.4 % — ABNORMAL HIGH (ref 11.5–15.5)
WBC: 13 10*3/uL — ABNORMAL HIGH (ref 4.0–10.5)
WBC: 9.6 10*3/uL (ref 4.0–10.5)
nRBC: 0 % (ref 0.0–0.2)
nRBC: 0 % (ref 0.0–0.2)

## 2022-10-14 LAB — URINALYSIS, ROUTINE W REFLEX MICROSCOPIC
Bilirubin Urine: NEGATIVE
Glucose, UA: NEGATIVE mg/dL
Ketones, ur: NEGATIVE mg/dL
Nitrite: NEGATIVE
Protein, ur: 100 mg/dL — AB
RBC / HPF: >50 RBC/hpf (ref 0–5)
Specific Gravity, Urine: 1.012 (ref 1.005–1.030)
pH: 5 (ref 5.0–8.0)

## 2022-10-14 LAB — PREPARE PLATELET PHERESIS: Unit division: 0

## 2022-10-14 LAB — BASIC METABOLIC PANEL
Anion gap: 13 (ref 5–15)
Anion gap: 10 (ref 5–15)
BUN: 90 mg/dL — ABNORMAL HIGH (ref 8–23)
BUN: 91 mg/dL — ABNORMAL HIGH (ref 8–23)
CO2: 21 mmol/L — ABNORMAL LOW (ref 22–32)
CO2: 20 mmol/L — ABNORMAL LOW (ref 22–32)
Calcium: 7.8 mg/dL — ABNORMAL LOW (ref 8.9–10.3)
Calcium: 7.9 mg/dL — ABNORMAL LOW (ref 8.9–10.3)
Chloride: 105 mmol/L (ref 98–111)
Chloride: 109 mmol/L (ref 98–111)
Creatinine, Ser: 3.5 mg/dL — ABNORMAL HIGH (ref 0.61–1.24)
Creatinine, Ser: 3.38 mg/dL — ABNORMAL HIGH (ref 0.61–1.24)
GFR, Estimated: 18 mL/min — ABNORMAL LOW (ref >60)
GFR, Estimated: 18 mL/min — ABNORMAL LOW (ref >60)
Glucose, Bld: 120 mg/dL — ABNORMAL HIGH (ref 70–99)
Glucose, Bld: 115 mg/dL — ABNORMAL HIGH (ref 70–99)
Potassium: 4.5 mmol/L (ref 3.5–5.1)
Potassium: 5 mmol/L (ref 3.5–5.1)
Sodium: 139 mmol/L (ref 135–145)
Sodium: 139 mmol/L (ref 135–145)

## 2022-10-14 LAB — APTT: aPTT: 51 seconds — ABNORMAL HIGH (ref 24–36)

## 2022-10-14 LAB — BPAM PLATELET PHERESIS
Blood Product Expiration Date: 202404022359
Unit Type and Rh: 6200

## 2022-10-14 LAB — MAGNESIUM: Magnesium: 2.6 mg/dL — ABNORMAL HIGH (ref 1.7–2.4)

## 2022-10-14 LAB — HEMOGLOBIN AND HEMATOCRIT, BLOOD
HCT: 26.2 % — ABNORMAL LOW (ref 39.0–52.0)
Hemoglobin: 8.4 g/dL — ABNORMAL LOW (ref 13.0–17.0)

## 2022-10-14 LAB — PROTIME-INR
INR: 1.7 — ABNORMAL HIGH (ref 0.8–1.2)
Prothrombin Time: 19.4 seconds — ABNORMAL HIGH (ref 11.4–15.2)

## 2022-10-14 LAB — FIBRINOGEN: Fibrinogen: 665 mg/dL — ABNORMAL HIGH (ref 210–475)

## 2022-10-14 NOTE — Progress Notes (Signed)
Pulmonary [place]   PULMONARY CRITICAL CARE SERVICE  PROGRESS NOTE     Vashon Marsico.  [id number]  DOB: 01/25/1946   DOA: 10/13/2022  Referring Physician: Satira Sark, MD  HPI: Kiante Ninh. is a 77 y.o. male being followed for ventilator/airway/oxygen weaning Acute on Chronic Respiratory Failure.  Patient currently is on 10 L Oxymizer appears to be comfortable without any distress  Medications: Reviewed on Rounds  Physical Exam:  Vitals: Temperature is 98.1 pulse of 78 respiratory rate 24 blood pressure 114/65 saturation is 98%  Ventilator Settings patient is on 10 L Oxymizer  General: Comfortable at this time Neck: supple Cardiovascular: no malignant arrhythmias Respiratory: No rhonchi very coarse breath sounds Skin: no rash seen on limited exam Musculoskeletal: No gross abnormality Psychiatric:unable to assess Neurologic:no involuntary movements         Lab Data:   Basic Metabolic Panel: Recent Labs  Lab 10/10/22 1028 10/11/22 0121 10/12/22 0204 10/13/22 1115 10/14/22 0547  NA 134* 138 134* 138 139  K 5.4* 4.8 4.1 4.7 4.5  CL 101 104 101 104 105  CO2 19* 21* 21* 18* 21*  GLUCOSE 92 116* 123* 100* 120*  BUN 63* 68* 71* 83* 90*  CREATININE 3.71* 3.82* 3.61* 3.62* 3.50*  CALCIUM 8.0* 8.2* 8.0* 7.9* 7.8*    ABG: Recent Labs  Lab 10/10/22 0628  PHART 7.3*  PCO2ART 44  PO2ART 79*  HCO3 21.6  O2SAT 97.9    Liver Function Tests: No results for input(s): "AST", "ALT", "ALKPHOS", "BILITOT", "PROT", "ALBUMIN" in the last 168 hours. No results for input(s): "LIPASE", "AMYLASE" in the last 168 hours. No results for input(s): "AMMONIA" in the last 168 hours.  CBC: Recent Labs  Lab 10/11/22 0121 10/12/22 0204 10/13/22 1115 10/13/22 1743 10/14/22 0135 10/14/22 0547  WBC 18.8* 14.3* 12.6*  --  13.0* 9.6  HGB 7.9* 7.5* 6.8* 7.8* 7.7* 6.8*  HCT 25.6* 23.0* 21.9* 25.3* 24.5*  21.4*  MCV 97.0 94.3 98.6  --  94.6 93.0  PLT 165 132* 137*  --  144* 109*    Cardiac Enzymes: Recent Labs  Lab 10/10/22 2219 10/11/22 1656  CKTOTAL 41* 23*    BNP (last 3 results) No results for input(s): "BNP" in the last 8760 hours.  ProBNP (last 3 results) No results for input(s): "PROBNP" in the last 8760 hours.  Radiological Exams: DG Abd 1 View  Result Date: 10/14/2022 CLINICAL DATA:  [id number] Anemia [id number] EXAM: ABDOMEN - 1 VIEW COMPARISON:  CT 10/08/2022 FINDINGS: Normal bowel gas pattern. Multiple subcentimeter calcified gallstones. Sternotomy wires. 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 IMPRESSION: 1. Normal bowel gas pattern. 2. Cholelithiasis. Electronically Signed   By: Lucrezia Europe M.D.   On: 10/14/2022 10:05    Assessment/Plan Active Problems:   Acute on chronic respiratory failure with hypoxia (HCC)   Left upper lobe pneumonia   Severe sepsis (HCC)   Chronic atrial fibrillation with RVR (HCC)   Malignant pleural effusion   Acute on chronic respiratory failure hypoxia patient currently is on 10 L Oxymizer will continue to use the BiPAP at nighttime as tolerated. Left upper lobe pneumonia persistent appears to be suspicious for possibility of malignancy also since it has been nonresolving Severe sepsis treated we will continue with supportive care Chronic atrial fibrillation rate is controlled Malignant pleural effusion will continue to monitor   I have personally seen and evaluated the patient, evaluated laboratory  and imaging results, formulated the assessment and plan and placed orders. The Patient requires high complexity decision making with multiple systems involvement.  Rounds were done with the Respiratory Therapy Director and Staff therapists and discussed with nursing staff also.  Allyne Gee, MD [facility] Pulmonary Critical Care Medicine Sleep Medicine

## 2022-10-15 ENCOUNTER — Other Ambulatory Visit (HOSPITAL_COMMUNITY): Payer: Medicare Other

## 2022-10-15 DIAGNOSIS — J189 Pneumonia, unspecified organism: Secondary | ICD-10-CM | POA: Diagnosis not present

## 2022-10-15 DIAGNOSIS — J910 Malignant pleural effusion: Secondary | ICD-10-CM | POA: Diagnosis not present

## 2022-10-15 DIAGNOSIS — I482 Chronic atrial fibrillation, unspecified: Secondary | ICD-10-CM | POA: Diagnosis not present

## 2022-10-15 DIAGNOSIS — J9621 Acute and chronic respiratory failure with hypoxia: Secondary | ICD-10-CM | POA: Diagnosis not present

## 2022-10-15 LAB — CBC
HCT: 28.2 % — ABNORMAL LOW (ref 39.0–52.0)
Hemoglobin: 9.1 g/dL — ABNORMAL LOW (ref 13.0–17.0)
MCH: 29.8 pg (ref 26.0–34.0)
MCHC: 32.3 g/dL (ref 30.0–36.0)
MCV: 92.5 fL (ref 80.0–100.0)
Platelets: 131 10*3/uL — ABNORMAL LOW (ref 150–400)
RBC: 3.05 MIL/uL — ABNORMAL LOW (ref 4.22–5.81)
RDW: 22 % — ABNORMAL HIGH (ref 11.5–15.5)
WBC: 11.2 10*3/uL — ABNORMAL HIGH (ref 4.0–10.5)
nRBC: 0 % (ref 0.0–0.2)

## 2022-10-15 LAB — TYPE AND SCREEN
ABO/RH(D): A POS
Antibody Screen: NEGATIVE
Unit division: 0
Unit division: 0

## 2022-10-15 LAB — CULTURE, BLOOD (ROUTINE X 2)
Culture: NO GROWTH
Culture: NO GROWTH
Special Requests: ADEQUATE

## 2022-10-15 LAB — URINE CULTURE: Culture: NO GROWTH

## 2022-10-15 LAB — BPAM RBC
Blood Product Expiration Date: 202404162359
Blood Product Expiration Date: 202404182359
ISSUE DATE / TIME: 202403301312
ISSUE DATE / TIME: 202403311148
Unit Type and Rh: 6200
Unit Type and Rh: 6200

## 2022-10-15 LAB — BASIC METABOLIC PANEL
Anion gap: 15 (ref 5–15)
BUN: 92 mg/dL — ABNORMAL HIGH (ref 8–23)
CO2: 19 mmol/L — ABNORMAL LOW (ref 22–32)
Calcium: 8.1 mg/dL — ABNORMAL LOW (ref 8.9–10.3)
Chloride: 106 mmol/L (ref 98–111)
Creatinine, Ser: 3.46 mg/dL — ABNORMAL HIGH (ref 0.61–1.24)
GFR, Estimated: 18 mL/min — ABNORMAL LOW (ref >60)
Glucose, Bld: 127 mg/dL — ABNORMAL HIGH (ref 70–99)
Potassium: 4.9 mmol/L (ref 3.5–5.1)
Sodium: 140 mmol/L (ref 135–145)

## 2022-10-15 NOTE — Progress Notes (Signed)
Pulmonary [place]   PULMONARY CRITICAL CARE SERVICE  PROGRESS NOTE     Brent Fitzgerald.  [id number]  DOB: 11/16/1945   DOA: 10/08/2022  Referring Physician: Satira Sark, MD  HPI: Brent Fitzgerald. is a 77 y.o. male being followed for ventilator/airway/oxygen weaning Acute on Chronic Respiratory Failure.  Patient is on BiPAP this morning on 40% FiO2.  His wife was present in the room I had lengthy discussion with her.  She confirmed that he does not want to be placed on mechanical ventilation he does not want to be kept alive by any artificial means.  In addition they are reviewed the chest x-ray findings with her.  Explained to her that I believe that part of the reason this infiltrate is not resolving may very well be due to the fact that it might be malignant.  The wife also had mention possibility of comfort care I did discuss with her how the comfort care process would occur.  Apparently the daughter is supposed to be coming and and she will discuss this conversation with her.  Medications: Reviewed on Rounds  Physical Exam:  Vitals: Temperature is 96.8 pulse 88 respiratory 22 blood pressure is 119/65 saturation is 100%  Ventilator Settings BiPAP FiO2 40% pressure 14/5  General: Comfortable at this time Neck: supple Cardiovascular: no malignant arrhythmias Respiratory: No rhonchi very coarse breath sounds are noted Skin: no rash seen on limited exam Musculoskeletal: No gross abnormality Psychiatric:unable to assess Neurologic:no involuntary movements         Lab Data:   Basic Metabolic Panel: Recent Labs  Lab 10/12/22 0204 10/13/22 1115 10/14/22 0547 10/14/22 1726 10/15/22 0128  NA 134* 138 139 139 140  K 4.1 4.7 4.5 5.0 4.9  CL 101 104 105 109 106  CO2 21* 18* 21* 20* 19*  GLUCOSE 123* 100* 120* 115* 127*  BUN 71* 83* 90* 91* 92*  CREATININE 3.61* 3.62* 3.50* 3.38* 3.46*  CALCIUM 8.0* 7.9*  7.8* 7.9* 8.1*  MG  --   --   --  2.6*  --     ABG: Recent Labs  Lab 10/10/22 0628  PHART 7.3*  PCO2ART 44  PO2ART 79*  HCO3 21.6  O2SAT 97.9    Liver Function Tests: No results for input(s): "AST", "ALT", "ALKPHOS", "BILITOT", "PROT", "ALBUMIN" in the last 168 hours. No results for input(s): "LIPASE", "AMYLASE" in the last 168 hours. No results for input(s): "AMMONIA" in the last 168 hours.  CBC: Recent Labs  Lab 10/12/22 0204 10/13/22 1115 10/13/22 1743 10/14/22 0135 10/14/22 0547 10/14/22 1726 10/15/22 0128  WBC 14.3* 12.6*  --  13.0* 9.6  --  11.2*  HGB 7.5* 6.8* 7.8* 7.7* 6.8* 8.4* 9.1*  HCT 23.0* 21.9* 25.3* 24.5* 21.4* 26.2* 28.2*  MCV 94.3 98.6  --  94.6 93.0  --  92.5  PLT 132* 137*  --  144* 109*  --  131*    Cardiac Enzymes: Recent Labs  Lab 10/10/22 2219 10/11/22 1656  CKTOTAL 41* 23*    BNP (last 3 results) No results for input(s): "BNP" in the last 8760 hours.  ProBNP (last 3 results) No results for input(s): "PROBNP" in the last 8760 hours.  Radiological Exams: DG Chest Port 1 View  Result Date: 10/15/2022 CLINICAL DATA:  Dyspnea. EXAM: PORTABLE CHEST 1 VIEW COMPARISON:  10/12/2022. FINDINGS: 1010 hours. Unchanged right chest Port-A-Cath with tip projecting over the superior cavoatrial junction. Unchanged bilateral upper and  left lower lobe consolidative opacities with diffuse interstitial prominence throughout both lungs. Stable cardiac and mediastinal contours. No pleural effusion or pneumothorax. Unchanged diffuse heterogeneous sclerosis of the visualized bones. IMPRESSION: Unchanged bilateral upper and left lower lobe consolidative opacities with diffuse interstitial prominence throughout both lungs. Findings may represent multifocal pneumonia and/or pulmonary edema. Electronically Signed   By: Emmit Alexanders M.D.   On: 10/15/2022 10:30   DG Abd 1 View  Result Date: 10/14/2022 CLINICAL DATA:  [id number] Anemia [id number] EXAM: ABDOMEN - 1 VIEW  COMPARISON:  CT 10/08/2022 FINDINGS: Normal bowel gas pattern. Multiple subcentimeter calcified gallstones. Sternotomy wires. 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 IMPRESSION: 1. Normal bowel gas pattern. 2. Cholelithiasis. Electronically Signed   By: Lucrezia Europe M.D.   On: 10/14/2022 10:05    Assessment/Plan Active Problems:   Acute on chronic respiratory failure with hypoxia   Left upper lobe pneumonia   Severe sepsis   Chronic atrial fibrillation with RVR   Malignant pleural effusion   Acute on chronic respiratory failure hypoxia plan is going to be to continue with BiPAP 40% FiO2 continue secretion management supportive care.  Overall patient's prognosis is poor and I have discussed the prognosis as well as the findings with the patient's wife at the bedside Left upper lobe pneumonia has been treated we will continue to monitor along closely. Severe sepsis treated and resolution Chronic atrial fibrillation rate is controlled Pleural effusion supportive care   I have personally seen and evaluated the patient, evaluated laboratory and imaging results, formulated the assessment and plan and placed orders. The Patient requires high complexity decision making with multiple systems involvement.  Rounds were done with the Respiratory Therapy Director and Staff therapists and discussed with nursing staff also.  Allyne Gee, MD [facility] Pulmonary Critical Care Medicine Sleep Medicine

## 2022-10-15 DEATH — deceased

## 2022-10-16 LAB — CULTURE, BLOOD (SINGLE)
Culture: NO GROWTH
Culture: NO GROWTH
Special Requests: ADEQUATE
Special Requests: ADEQUATE

## 2022-11-14 DEATH — deceased

## 2023-02-19 IMAGING — PT NM PET TUM IMG RESTAG (PS) SKULL BASE T - THIGH
1 series · 5 of 5 positions shown · non-contrast
Comparison: 10/05/2020

CLINICAL DATA: Subsequent treatment strategy for pulmonary nodule.
History of prostate cancer.

EXAM:
NUCLEAR MEDICINE PET SKULL BASE TO THIGH
TECHNIQUE: 9.3 mCi F-18 FDG was injected intravenously. Full-ring PET imaging
was performed from the skull base to thigh after the radiotracer. CT
data was obtained and used for attenuation correction and anatomic
localization.
Fasting blood glucose: 118 mg/dl

[Series 8098: results mm oncology reading · 3.0mm · 1.06mm/px · 5 of 5 slices shown]
[im 1/5]
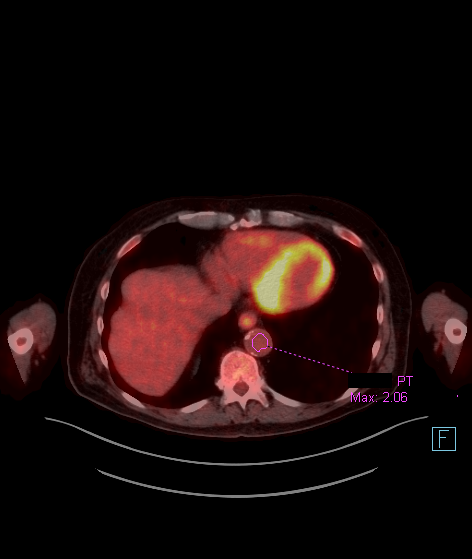
[im 2/5]
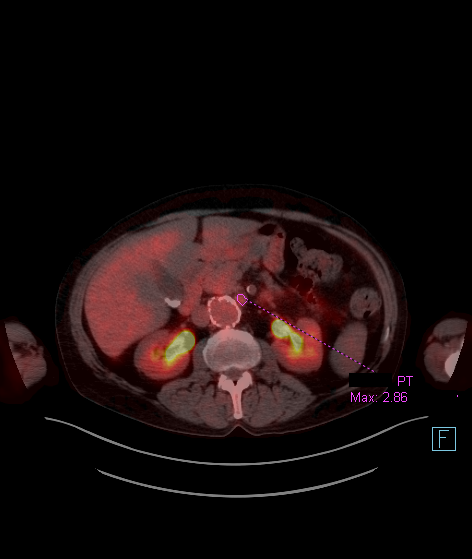
[im 3/5]
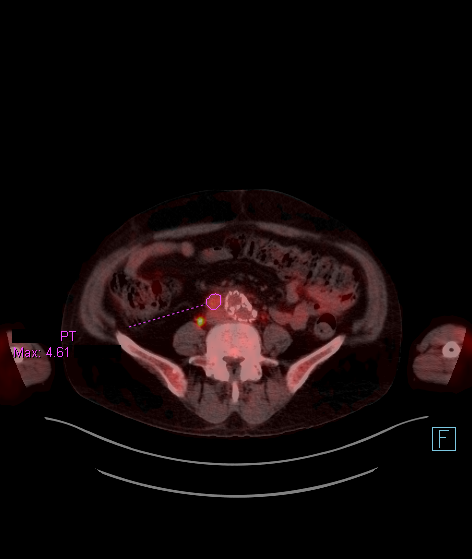
[im 4/5]
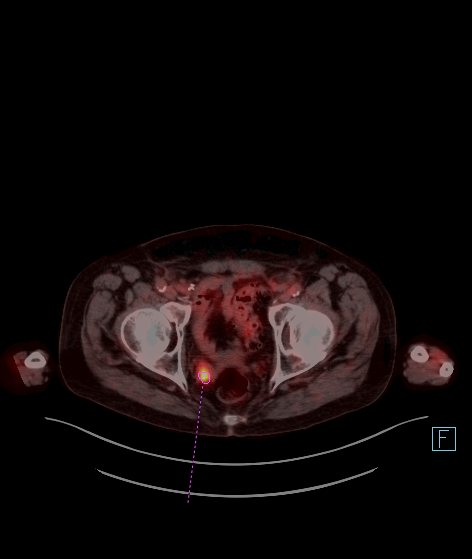
[im 5/5]
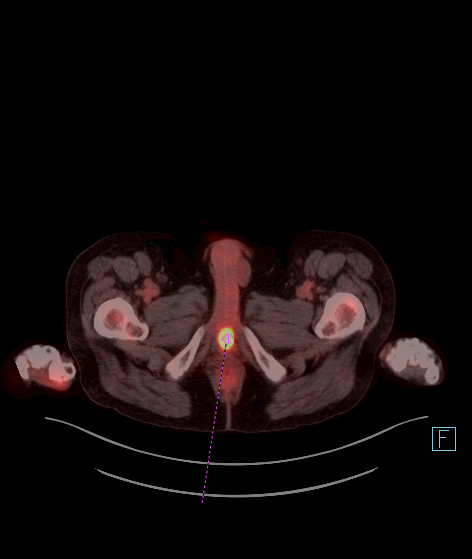

[5 of 5 positions shown; findings below may reference images not displayed]

FINDINGS: Mediastinal blood pool activity: SUV max

Liver activity: SUV max NA

NECK: No areas of abnormal hypermetabolism.

Incidental CT findings: No cervical adenopathy. Presumed sebaceous
cyst about the posterior right neck midline at 1.8 cm on 42/3.
Bilateral carotid atherosclerosis.

CHEST: The previously described hypermetabolic left lower lobe
process has resolved, as has the left pleural effusion. No thoracic
nodal hypermetabolism.

Incidental CT findings: Aortic atherosclerosis. Median sternotomy
for CABG. Centrilobular and paraseptal emphysema.

Right lower lobe 11 mm pulmonary nodule on 115/3 is similar in size
to on the prior exam and not FDG avid.

ABDOMEN/PELVIS: A pre caval node at the level of the aortic
bifurcation measures 1.3 cm and a S.U.V. max of 4.6 on 184/3.
Enlarged from 7 mm on the prior PET, where it was not
hypermetabolic.

Left periaortic node measures 1.3 cm and a S.U.V. max of 2.9 on
164/3, enlarged from 7 mm on the prior exam, where was not
hypermetabolic.

A deep right pelvic floor versus superior most right seminal vesicle
metastasis measures 2.0 cm and a S.U.V. max of 8.6 on 231/3 versus
1.3 cm on the prior exam (when remeasured).

Hypermetabolism along the course of the penile urethra is favored to
be physiologic secondary to altered pelvic floor anatomy in the
setting of prior prostatectomy. Similar.

Incidental CT findings: Dependent gallstones. Normal adrenal glands.
Abdominal aortic aneurysm stent graft. Prostatectomy. Left
paramidline ventral abdominal wall hernia contains fat.

SKELETON: Mild diffuse marrow hypermetabolism. Development of tiny
sclerotic lesions including throughout the pelvis (example 6 mm in
the left iliac on 189/3) consistent with metastatic disease

Incidental CT findings: none
IMPRESSION: 1. The left lower lobe process has resolved with resolution of
left-sided pleural effusion. This was likely related to atelectasis
or resolving infection.
2. Findings consistent with new abdominopelvic nodal metastasis,
presumably from the patient's primary prostate carcinoma.
3. Subtle diffuse marrow hypermetabolism, which given developing
tiny sclerotic foci, is most consistent with osseous metastasis from
prostate carcinoma.
4. Incidental findings, including: Aortic atherosclerosis
(GI805-Z7Q.Q) and emphysema (GI805-6Q4.5). Cholelithiasis.
5. Stable right lower lobe non FDG avid pulmonary nodule, favored to
be benign.
